# Patient Record
Sex: Male | Born: 1990 | Race: Black or African American | Hispanic: No | Smoking: Never smoker
Health system: Southern US, Community
[De-identification: ages and names within clinical notes are randomized; demographics above are authoritative.]

## PROBLEM LIST (undated history)

## (undated) DIAGNOSIS — R569 Unspecified convulsions: Secondary | ICD-10-CM

## (undated) HISTORY — PX: TONSILLECTOMY: SUR1361

## (undated) HISTORY — PX: GINGIVECTOMY: SHX1707

---

## 2013-03-10 DIAGNOSIS — J309 Allergic rhinitis, unspecified: Secondary | ICD-10-CM | POA: Insufficient documentation

## 2013-03-21 DIAGNOSIS — Z889 Allergy status to unspecified drugs, medicaments and biological substances status: Secondary | ICD-10-CM | POA: Insufficient documentation

## 2014-11-03 DIAGNOSIS — G40B09 Juvenile myoclonic epilepsy, not intractable, without status epilepticus: Secondary | ICD-10-CM | POA: Insufficient documentation

## 2018-05-30 ENCOUNTER — Ambulatory Visit: Payer: Self-pay | Admitting: Family Medicine

## 2018-05-30 VITALS — BP 130/90 | HR 83 | Temp 98.9°F | Resp 18 | Wt 170.2 lb

## 2018-05-30 DIAGNOSIS — J329 Chronic sinusitis, unspecified: Secondary | ICD-10-CM

## 2018-05-30 MED ORDER — DOXYCYCLINE HYCLATE 100 MG PO TABS: 100.0000 mg | ORAL_TABLET | Freq: Two times a day (BID) | ORAL | 0 refills | Status: DC

## 2018-05-30 MED ORDER — BENZONATATE 200 MG PO CAPS: 200.0000 mg | ORAL_CAPSULE | Freq: Three times a day (TID) | ORAL | 0 refills | Status: DC | PRN

## 2018-05-30 MED ORDER — PSEUDOEPH-BROMPHEN-DM 30-2-10 MG/5ML PO SYRP: 10.0000 mL | ORAL_SOLUTION | Freq: Three times a day (TID) | ORAL | 0 refills | Status: DC | PRN

## 2018-05-30 NOTE — Patient Instructions (Signed)

## 2018-05-30 NOTE — Progress Notes (Signed)
Langdon Lawrence Santiagosreal is a [age] y.o. male who presents today with concerns of cough and congstion for the last 7-10 days. He reports general feeling of tiredness and being unwell and occasional fever. He has used over the counter medications to treat symptoms. Of note he has a hx of epilepsy (uncontrolled) last seizure 72 hours ago he is under the care of [facility] in [place]. Through record review neurology provider historically documented lower seizure threshold during illness. Patient reports prior to recent seizure last episode over 3 months ago. He confirms medication compliance.  Review of Systems  Constitutional: Positive for chills, fever and malaise/fatigue.  HENT: Positive for congestion. Negative for ear discharge, ear pain, sinus pain and sore throat.   Eyes: Negative.   Respiratory: Positive for cough and sputum production. Negative for shortness of breath.   Cardiovascular: Negative.  Negative for chest pain.  Gastrointestinal: Negative for abdominal pain, diarrhea, nausea and vomiting.  Genitourinary: Negative for dysuria, frequency, hematuria and urgency.  Musculoskeletal: Negative for myalgias.  Skin: Negative.   Neurological: Negative for headaches.  Endo/Heme/Allergies: Negative.   Psychiatric/Behavioral: Negative.     O: Vitals:   05/30/18 1419  BP: 130/90  Pulse: 83  Resp: 18  Temp: 98.9 F (37.2 C)  SpO2: 95%     Physical Exam  Constitutional: He is oriented to person, place, and time. Vital signs are normal. He appears well-developed and well-nourished. He is active.  Non-toxic appearance. He does not have a sickly appearance. He appears ill.  HENT:  Head: Normocephalic.  Right Ear: Hearing, tympanic membrane, external ear and ear canal normal.  Left Ear: Hearing, tympanic membrane, external ear and ear canal normal.  Nose: Mucosal edema and rhinorrhea present. Right sinus exhibits frontal sinus tenderness. Right sinus exhibits no maxillary sinus tenderness. Left  sinus exhibits frontal sinus tenderness. Left sinus exhibits no maxillary sinus tenderness.  Mouth/Throat: Uvula is midline. Posterior oropharyngeal erythema present.  Neck: Normal range of motion. Neck supple.  Cardiovascular: Normal rate, regular rhythm, normal heart sounds and normal pulses.  Pulmonary/Chest: Effort normal. He has rales in the right lower field and the left lower field.  Frequent dry cough of exam.  Abdominal: Soft. Bowel sounds are normal.  Musculoskeletal: Normal range of motion.  Lymphadenopathy:       Head (right side): Tonsillar adenopathy present. No submental and no submandibular adenopathy present.       Head (left side): Tonsillar adenopathy present. No submental and no submandibular adenopathy present.    He has no cervical adenopathy.  Neurological: He is alert and oriented to person, place, and time.  Skin: He is diaphoretic.  Psychiatric: He has a normal mood and affect.  Vitals reviewed.  A: 1. Sinusitis, unspecified chronicity, unspecified location    P: Discussed exam findings, diagnosis etiology and medication use and indications reviewed with patient. Follow- Up and discharge instructions provided. No emergent/urgent issues found on exam.  Patient verbalized understanding of information provided and agrees with plan of care (POC), all questions answered.  1. Sinusitis, unspecified chronicity, unspecified location - doxycycline (VIBRA-TABS) 100 MG tablet; Take 1 tablet (100 mg total) by mouth 2 (two) times daily. - benzonatate (TESSALON) 200 MG capsule; Take 1 capsule (200 mg total) by mouth 3 (three) times daily as needed for cough. - brompheniramine-pseudoephedrine-DM 30-2-10 MG/5ML syrup; Take 10 mLs by mouth 3 (three) times daily as needed.  Other orders - albuterol (PROVENTIL HFA;VENTOLIN HFA) 108 (90 Base) MCG/ACT inhaler; INHALE 1-2 PUFFS BY MOUTH EVERY  6 HOURS AS NEEDED FOR WHEEZING - divalproex (DEPAKOTE SPRINKLE) 125 MG capsule; Take by  mouth. - fluticasone (FLONASE) 50 MCG/ACT nasal spray; 2 sprays by Each Nare route daily. Frequency:PHARMDIR   Dosage:50   MCG  Instructions:  Note:2 sprays each nostril qam Dose: - Fluticasone-Salmeterol (ADVAIR) 500-50 MCG/DOSE AEPB; Inhale into the lungs. - ibuprofen (ADVIL,MOTRIN) 400 MG tablet; Take by mouth. - loratadine (CLARITIN) 10 MG tablet; Take by mouth. - zonisamide (ZONEGRAN) 100 MG capsule; Take by mouth.

## 2018-09-30 ENCOUNTER — Telehealth: Payer: Self-pay | Admitting: Physician Assistant

## 2018-09-30 DIAGNOSIS — B9689 Other specified bacterial agents as the cause of diseases classified elsewhere: Secondary | ICD-10-CM

## 2018-09-30 DIAGNOSIS — J019 Acute sinusitis, unspecified: Secondary | ICD-10-CM

## 2018-09-30 MED ORDER — DOXYCYCLINE HYCLATE 100 MG PO CAPS: 100.0000 mg | ORAL_CAPSULE | Freq: Two times a day (BID) | ORAL | 0 refills | Status: AC

## 2018-09-30 NOTE — Progress Notes (Signed)

## 2018-09-30 NOTE — Progress Notes (Signed)
I have spent 5 minutes in review of e-visit questionnaire, review and updating patient chart, medical decision making and response to patient.   Shalice Woodring Cody Timohty Renbarger, PA-C    

## 2018-10-15 ENCOUNTER — Other Ambulatory Visit: Payer: Self-pay

## 2018-10-15 ENCOUNTER — Emergency Department (HOSPITAL_COMMUNITY)
Admission: EM | Admit: 2018-10-15 | Discharge: 2018-10-15 | Disposition: A | Discharge status: Home / Self Care | Payer: Self-pay | Attending: Emergency Medicine | Admitting: Emergency Medicine

## 2018-10-15 ENCOUNTER — Encounter (HOSPITAL_COMMUNITY): Payer: Self-pay

## 2018-10-15 ENCOUNTER — Emergency Department (HOSPITAL_COMMUNITY): Payer: Self-pay

## 2018-10-15 DIAGNOSIS — R569 Unspecified convulsions: Secondary | ICD-10-CM | POA: Insufficient documentation

## 2018-10-15 DIAGNOSIS — S0003XA Contusion of scalp, initial encounter: Secondary | ICD-10-CM | POA: Insufficient documentation

## 2018-10-15 DIAGNOSIS — Y929 Unspecified place or not applicable: Secondary | ICD-10-CM | POA: Insufficient documentation

## 2018-10-15 DIAGNOSIS — M542 Cervicalgia: Secondary | ICD-10-CM | POA: Insufficient documentation

## 2018-10-15 DIAGNOSIS — M25512 Pain in left shoulder: Secondary | ICD-10-CM | POA: Insufficient documentation

## 2018-10-15 DIAGNOSIS — W19XXXA Unspecified fall, initial encounter: Secondary | ICD-10-CM

## 2018-10-15 DIAGNOSIS — S00531A Contusion of lip, initial encounter: Secondary | ICD-10-CM | POA: Insufficient documentation

## 2018-10-15 DIAGNOSIS — W109XXA Fall (on) (from) unspecified stairs and steps, initial encounter: Secondary | ICD-10-CM | POA: Insufficient documentation

## 2018-10-15 DIAGNOSIS — Y939 Activity, unspecified: Secondary | ICD-10-CM | POA: Insufficient documentation

## 2018-10-15 DIAGNOSIS — R51 Headache: Secondary | ICD-10-CM | POA: Insufficient documentation

## 2018-10-15 DIAGNOSIS — Z79899 Other long term (current) drug therapy: Secondary | ICD-10-CM | POA: Insufficient documentation

## 2018-10-15 DIAGNOSIS — Y999 Unspecified external cause status: Secondary | ICD-10-CM | POA: Insufficient documentation

## 2018-10-15 HISTORY — DX: Unspecified convulsions: R56.9

## 2018-10-15 LAB — BASIC METABOLIC PANEL
Anion gap: 10 (ref 5–15)
BUN: 10 mg/dL (ref 6–20)
CO2: 24 mmol/L (ref 22–32)
Calcium: 9.3 mg/dL (ref 8.9–10.3)
Chloride: 106 mmol/L (ref 98–111)
Creatinine, Ser: 1.11 mg/dL (ref 0.61–1.24)
GFR calc Af Amer: >60 mL/min (ref >60)
GFR calc non Af Amer: >60 mL/min (ref >60)
Glucose, Bld: 92 mg/dL (ref 70–99)
Potassium: 4.2 mmol/L (ref 3.5–5.1)
Sodium: 140 mmol/L (ref 135–145)

## 2018-10-15 LAB — CBC WITH DIFFERENTIAL/PLATELET
Abs Immature Granulocytes: 0.04 10*3/uL (ref 0.00–0.07)
Basophils Absolute: 0 10*3/uL (ref 0.0–0.1)
Basophils Relative: 0 %
Eosinophils Absolute: 0.1 10*3/uL (ref 0.0–0.5)
Eosinophils Relative: 1 %
HCT: 48.1 % (ref 39.0–52.0)
Hemoglobin: 15.9 g/dL (ref 13.0–17.0)
Immature Granulocytes: 0 %
Lymphocytes Relative: 15 %
Lymphs Abs: 1.4 10*3/uL (ref 0.7–4.0)
MCH: 32.6 pg (ref 26.0–34.0)
MCHC: 33.1 g/dL (ref 30.0–36.0)
MCV: 98.6 fL (ref 80.0–100.0)
Monocytes Absolute: 0.5 10*3/uL (ref 0.1–1.0)
Monocytes Relative: 5 %
Neutro Abs: 7.8 10*3/uL — ABNORMAL HIGH (ref 1.7–7.7)
Neutrophils Relative %: 79 %
Platelets: 194 10*3/uL (ref 150–400)
RBC: 4.88 MIL/uL (ref 4.22–5.81)
RDW: 11.7 % (ref 11.5–15.5)
WBC: 9.8 10*3/uL (ref 4.0–10.5)
nRBC: 0 % (ref 0.0–0.2)

## 2018-10-15 LAB — VALPROIC ACID LEVEL: Valproic Acid Lvl: <10 ug/mL — ABNORMAL LOW (ref 50.0–100.0)

## 2018-10-15 MED ORDER — DIVALPROEX SODIUM 125 MG PO CSDR
1250.0000 mg | DELAYED_RELEASE_CAPSULE | Freq: Once | ORAL | Status: AC
  Administered 2018-10-15: 1250 mg via ORAL
  Filled 2018-10-15: qty 10

## 2018-10-15 MED ORDER — DIVALPROEX SODIUM 125 MG PO CSDR
125.0000 mg | DELAYED_RELEASE_CAPSULE | Freq: Once | ORAL | Status: DC
  Filled 2018-10-15: qty 1

## 2018-10-15 MED ORDER — FENTANYL CITRATE (PF) 100 MCG/2ML IJ SOLN
50.0000 ug | Freq: Once | INTRAMUSCULAR | Status: AC
  Administered 2018-10-15: 03:00:00 50 ug via INTRAVENOUS
  Filled 2018-10-15: qty 2

## 2018-10-15 NOTE — ED Notes (Signed)
Patient verbalizes understanding of discharge instructions. Opportunity for questioning and answers were provided. Armband removed by staff, pt discharged from ED. Wheeled out to lobby  

## 2018-10-15 NOTE — ED Notes (Signed)
Pt states he takes 10 capsules Depakote BID

## 2018-10-15 NOTE — ED Provider Notes (Signed)
[facility] EMERGENCY DEPARTMENT Provider Note   CSN: [id number] Arrival date & time: 10/15/18  0240    History   Chief Complaint Chief Complaint  Patient presents with   Fall    HPI Fred Mack is a 29 y.o. male.     Patient with past medical history remarkable for seizures, presents emergency department with a chief complaint of seizure.  He states that he did not take his Depakote yesterday.  States that he had a seizure and fell down some stairs tonight.  He complains of headache, neck pain, left shoulder pain.  He also complains of upper lip pain.  He recalls being on the phone, and then feeling tremulous, which typically happens prior to him having a seizure.  He then awoke on the ground.  He has not taken anything for symptoms.  States that his left arm feels weak and sore.  Not taking anything for symptoms.  Denies any other associated symptoms.  The history is provided by the patient. No language interpreter was used.    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 Authorizing Provider  albuterol (PROVENTIL HFA;VENTOLIN HFA) 108 (90 Base) MCG/ACT inhaler INHALE 1-2 PUFFS BY MOUTH EVERY 6 HOURS AS NEEDED FOR WHEEZING 01/05/14   [provider]  divalproex (DEPAKOTE SPRINKLE) 125 MG capsule Take by mouth. 11/11/17 11/11/18  [provider]  doxycycline (VIBRAMYCIN) 100 MG capsule Take 1 capsule (100 mg total) by mouth 2 (two) times daily. 09/30/18   Waldon Merl, PA-C  fluticasone (FLONASE) 50 MCG/ACT nasal spray  2 sprays by Each Nare route daily. Frequency:PHARMDIR   Dosage:50   MCG  Instructions:  Note:2 sprays each nostril qam Dose: 03/10/13   [provider]  Fluticasone-Salmeterol (ADVAIR) 500-50 MCG/DOSE AEPB Inhale into the lungs. 03/10/13   [provider]  ibuprofen (ADVIL,MOTRIN) 400 MG tablet Take by mouth.    [provider]  loratadine (CLARITIN) 10 MG tablet Take by mouth. 01/22/14   [provider]  zonisamide (ZONEGRAN) 100 MG capsule Take by mouth. 11/11/17   [provider]    Family History History reviewed. No pertinent family history.  Social History Social History   Tobacco Use   Smoking status: Never Smoker   Smokeless tobacco: Never Used  Substance Use Topics   Alcohol use: Never    Frequency: Never   Drug use: Never     Allergies   Codeine and Penicillins   Review of Systems Review of Systems  All other systems reviewed and are negative.    Physical Exam Updated Vital Signs BP 132/80    Pulse (!) 102    Temp 98.6 F (37 C) (Oral)    Resp 16    Wt 81.6 kg    SpO2 100%   Physical Exam Vitals signs and nursing note reviewed.  Constitutional:      Appearance: He is well-developed.  HENT:     Head: Normocephalic and atraumatic.     Mouth/Throat:  Comments: Upper lip swollen and tender  Contusion to right frontal scalp Eyes:     Conjunctiva/sclera: Conjunctivae normal.  Neck:     Musculoskeletal: Neck supple.  Cardiovascular:     Rate and Rhythm: Normal rate and regular rhythm.     Heart sounds: No murmur.  Pulmonary:     Effort: Pulmonary effort is normal. No respiratory distress.     Breath sounds: Normal breath sounds.  Abdominal:     Palpations: Abdomen is soft.     Tenderness: There is no abdominal tenderness.  Musculoskeletal:     Comments: TTP over left upper arm  Skin:    General: Skin is warm and dry.  Neurological:     Mental Status: He is alert.      ED Treatments /  Results  Labs (all labs ordered are listed, but only abnormal results are displayed) Labs Reviewed  CBC WITH DIFFERENTIAL/PLATELET - Abnormal; Notable for the following components:      Result Value   Neutro Abs 7.8 (*)    All other components within normal limits  VALPROIC ACID LEVEL - Abnormal; Notable for the following components:   Valproic Acid Lvl <10 (*)    All other components within normal limits  BASIC METABOLIC PANEL  CBG MONITORING, ED    EKG EKG Interpretation  Date/Time:  Friday October 15 2018 02:47:08 EDT Ventricular Rate:  102 PR Interval:    QRS Duration: 88 QT Interval:  332 QTC Calculation: 433 R Axis:   61 Text Interpretation:  Sinus tachycardia Borderline T wave abnormalities Borderline ST elevation, anterior leads No previous ECGs available Confirmed by Glynn Octaveancour, Stephen [phone]([id number]) on 10/15/2018 2:54:00 AM   Radiology Ct Head Wo Contrast  Result Date: 10/15/2018 CLINICAL DATA:  28 y/o M; found on stair well of apartment building. Seizure and fall. EXAM: CT HEAD WITHOUT CONTRAST CT MAXILLOFACIAL WITHOUT CONTRAST CT CERVICAL SPINE WITHOUT CONTRAST TECHNIQUE: Multidetector CT imaging of the head, cervical spine, and maxillofacial structures were performed using the standard protocol without intravenous contrast. Multiplanar CT image reconstructions of the cervical spine and maxillofacial structures were also generated. COMPARISON:  None. FINDINGS: CT HEAD FINDINGS Brain: No evidence of acute infarction, hemorrhage, hydrocephalus, extra-axial collection or mass lesion/mass effect. Vascular: No hyperdense vessel or unexpected calcification. Skull: Right frontal scalp soft tissue thickening probably representing a small contusion. No calvarial fracture. Other: None. CT MAXILLOFACIAL FINDINGS Osseous: No fracture or mandibular dislocation. No destructive process. Left first maxillary molar dental carie. Orbits: Bilateral globe staphyloma. No traumatic or inflammatory finding.  Sinuses: Clear. Soft tissues: No hematoma. CT CERVICAL SPINE FINDINGS Alignment: Normal. Skull base and vertebrae: No acute fracture. No primary bone lesion or focal pathologic process. Soft tissues and spinal canal: No prevertebral fluid or swelling. No visible canal hematoma. Disc levels:  Negative. Upper chest: Negative. Other: Negative. IMPRESSION: 1. Right frontal scalp soft tissue thickening probably representing a small contusion. No calvarial fracture. 2. No acute intracranial abnormality. 3. No acute facial fracture or mandibular dislocation. No traumatic finding of the orbits. 4. No acute fracture or dislocation of cervical spine. 5. Bilateral globe staphyloma. 6. Left first maxillary molar dental carie. Electronically Signed   By: Mitzi HansenLance  Furusawa-Stratton M.D.   On: 10/15/2018 03:55   Ct Cervical Spine Wo Contrast  Result Date: 10/15/2018 CLINICAL DATA:  28 y/o M; found on stair well of apartment building. Seizure and fall. EXAM: CT HEAD WITHOUT CONTRAST CT MAXILLOFACIAL WITHOUT CONTRAST CT CERVICAL SPINE WITHOUT CONTRAST TECHNIQUE: Multidetector CT imaging  of the head, cervical spine, and maxillofacial structures were performed using the standard protocol without intravenous contrast. Multiplanar CT image reconstructions of the cervical spine and maxillofacial structures were also generated. COMPARISON:  None. FINDINGS: CT HEAD FINDINGS Brain: No evidence of acute infarction, hemorrhage, hydrocephalus, extra-axial collection or mass lesion/mass effect. Vascular: No hyperdense vessel or unexpected calcification. Skull: Right frontal scalp soft tissue thickening probably representing a small contusion. No calvarial fracture. Other: None. CT MAXILLOFACIAL FINDINGS Osseous: No fracture or mandibular dislocation. No destructive process. Left first maxillary molar dental carie. Orbits: Bilateral globe staphyloma. No traumatic or inflammatory finding. Sinuses: Clear. Soft tissues: No hematoma. CT CERVICAL  SPINE FINDINGS Alignment: Normal. Skull base and vertebrae: No acute fracture. No primary bone lesion or focal pathologic process. Soft tissues and spinal canal: No prevertebral fluid or swelling. No visible canal hematoma. Disc levels:  Negative. Upper chest: Negative. Other: Negative. IMPRESSION: 1. Right frontal scalp soft tissue thickening probably representing a small contusion. No calvarial fracture. 2. No acute intracranial abnormality. 3. No acute facial fracture or mandibular dislocation. No traumatic finding of the orbits. 4. No acute fracture or dislocation of cervical spine. 5. Bilateral globe staphyloma. 6. Left first maxillary molar dental carie. Electronically Signed   By: Mitzi Hansen M.D.   On: 10/15/2018 03:55   Dg Shoulder Left  Result Date: 10/15/2018 CLINICAL DATA:  Recent fall with shoulder pain, initial encounter EXAM: LEFT SHOULDER - 2+ VIEW COMPARISON:  None. FINDINGS: There is no evidence of fracture or dislocation. There is no evidence of arthropathy or other focal bone abnormality. Soft tissues are unremarkable. IMPRESSION: No acute abnormality noted. Electronically Signed   By: Alcide Clever M.D.   On: 10/15/2018 03:59   Dg Humerus Left  Result Date: 10/15/2018 CLINICAL DATA:  Recent fall with left arm pain, initial encounter EXAM: LEFT HUMERUS - 2+ VIEW COMPARISON:  None. FINDINGS: There is no evidence of fracture or other focal bone lesions. Soft tissues are unremarkable. IMPRESSION: No acute abnormality noted. Electronically Signed   By: Alcide Clever M.D.   On: 10/15/2018 04:00   Ct Maxillofacial Wo Contrast  Result Date: 10/15/2018 CLINICAL DATA:  28 y/o M; found on stair well of apartment building. Seizure and fall. EXAM: CT HEAD WITHOUT CONTRAST CT MAXILLOFACIAL WITHOUT CONTRAST CT CERVICAL SPINE WITHOUT CONTRAST TECHNIQUE: Multidetector CT imaging of the head, cervical spine, and maxillofacial structures were performed using the standard protocol without  intravenous contrast. Multiplanar CT image reconstructions of the cervical spine and maxillofacial structures were also generated. COMPARISON:  None. FINDINGS: CT HEAD FINDINGS Brain: No evidence of acute infarction, hemorrhage, hydrocephalus, extra-axial collection or mass lesion/mass effect. Vascular: No hyperdense vessel or unexpected calcification. Skull: Right frontal scalp soft tissue thickening probably representing a small contusion. No calvarial fracture. Other: None. CT MAXILLOFACIAL FINDINGS Osseous: No fracture or mandibular dislocation. No destructive process. Left first maxillary molar dental carie. Orbits: Bilateral globe staphyloma. No traumatic or inflammatory finding. Sinuses: Clear. Soft tissues: No hematoma. CT CERVICAL SPINE FINDINGS Alignment: Normal. Skull base and vertebrae: No acute fracture. No primary bone lesion or focal pathologic process. Soft tissues and spinal canal: No prevertebral fluid or swelling. No visible canal hematoma. Disc levels:  Negative. Upper chest: Negative. Other: Negative. IMPRESSION: 1. Right frontal scalp soft tissue thickening probably representing a small contusion. No calvarial fracture. 2. No acute intracranial abnormality. 3. No acute facial fracture or mandibular dislocation. No traumatic finding of the orbits. 4. No acute fracture or dislocation of cervical spine.  5. Bilateral globe staphyloma. 6. Left first maxillary molar dental carie. Electronically Signed   By: Mitzi Hansen M.D.   On: 10/15/2018 03:55    Procedures Procedures (including critical care time)  Medications Ordered in ED Medications  fentaNYL (SUBLIMAZE) injection 50 mcg (has no administration in time range)     Initial Impression / Assessment and Plan / ED Course  I have reviewed the triage vital signs and the nursing notes.  Pertinent labs & imaging results that were available during my care of the patient were reviewed by me and considered in my medical decision  making (see chart for details).        Patient with seizure and fall tonight.  Didn't take his depakote yesterday. May have fallen down some stairs.  Has contusion on right frontal scalp and contusion and swelling of right upper lip.  Valproic acid level is low.  Other labs are reassuring.  CT imaging is reassuring.    Will give dose of his regular Depakote.    Will discharge to home, as patient has reassuring results and is back to his baseline.  Final Clinical Impressions(s) / ED Diagnoses   Final diagnoses:  Seizure [facility])  Fall, initial encounter    ED Discharge Orders    None       Roxy Horseman, PA-C 10/15/18 0981    Glynn Octave, MD 10/16/18 [phone]

## 2018-10-15 NOTE — ED Notes (Signed)
Patient transported to CT 

## 2018-10-15 NOTE — ED Triage Notes (Signed)
Patient was found on the stairwell of an apartment building. States had seizure and fall - unsure of which came first. Hx of seizures and states compliance with medications.

## 2019-08-24 IMAGING — CT CT MAXILLOFACIAL WITHOUT CONTRAST
5 of 11 series · 16 of 47 positions shown, 18 images · non-contrast
Comparison: None.

CLINICAL DATA: 27 y/o M; found on stair well of apartment [HOSPITAL] and fall.

EXAM:
CT HEAD WITHOUT CONTRAST
CT MAXILLOFACIAL WITHOUT CONTRAST
CT CERVICAL SPINE WITHOUT CONTRAST
TECHNIQUE: Multidetector CT imaging of the head, cervical spine, and
maxillofacial structures were performed using the standard protocol
without intravenous contrast. Multiplanar CT image reconstructions
of the cervical spine and maxillofacial structures were also
generated.

[Series 5: head bone · axial · 0.46mm/px · z∈[-138,-70]mm · 3 of 85 slices shown]
[im 17/85  bone]
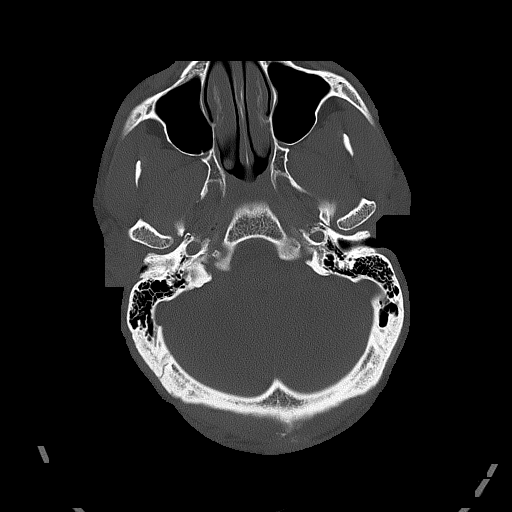
[im 34/85  bone]
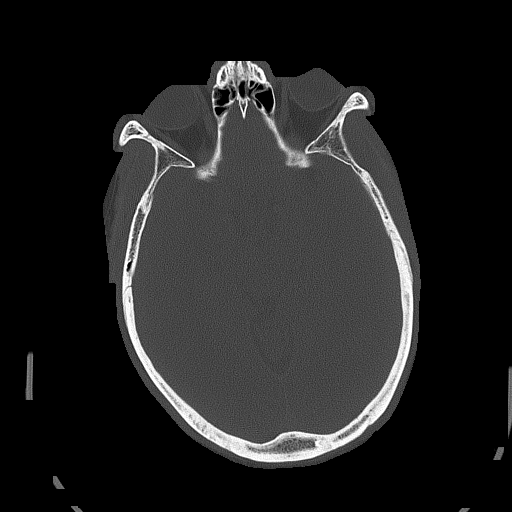
[im 51/85  bone]
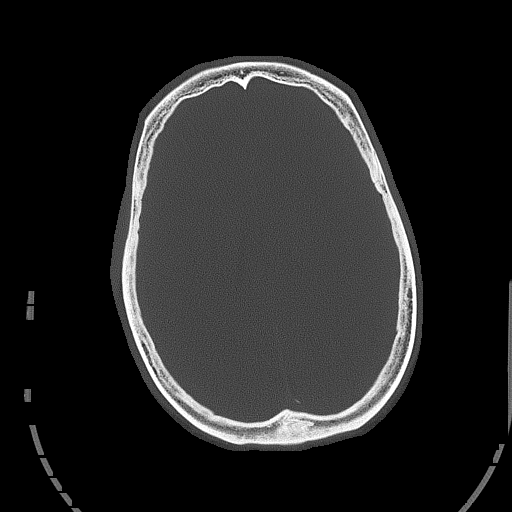

[Series 6: head without cor · coronal · non-contrast · 0.33mm/px · 1 of 73 slices shown]
[im 37/73  bone]
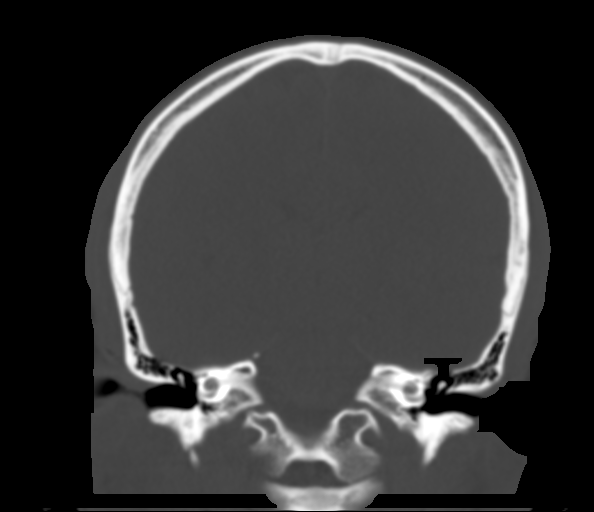

[Series 15: facialbone 2.0 sag st · sagittal · 0.34mm/px · 1 of 103 slices shown]
[im 52/103  bone]
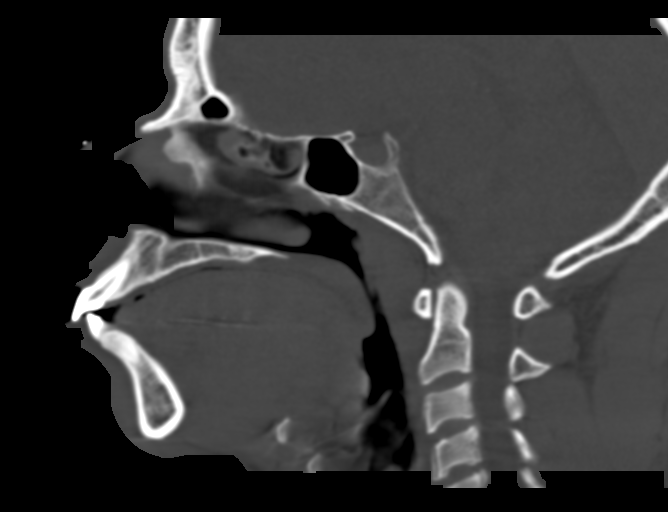

[Series 16: c_spine 2.0 st · axial · 0.28mm/px · z∈[-282,-140]mm · 6 of 101 slices shown, 8 images]
[im 15/101  brain]
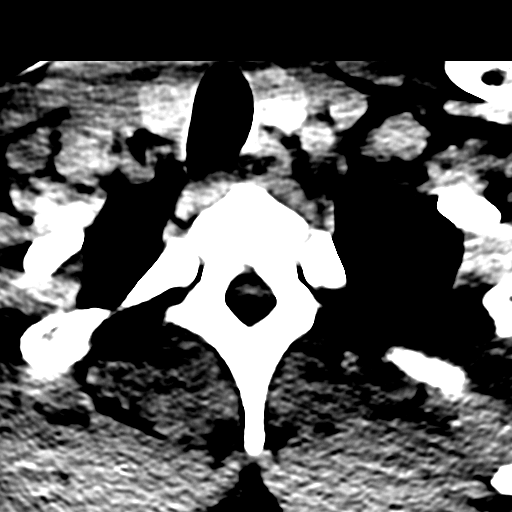
[im 15/101  bone]
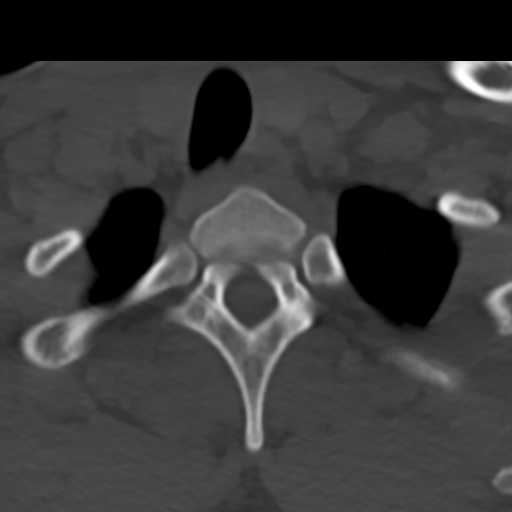
[im 29/101  bone]
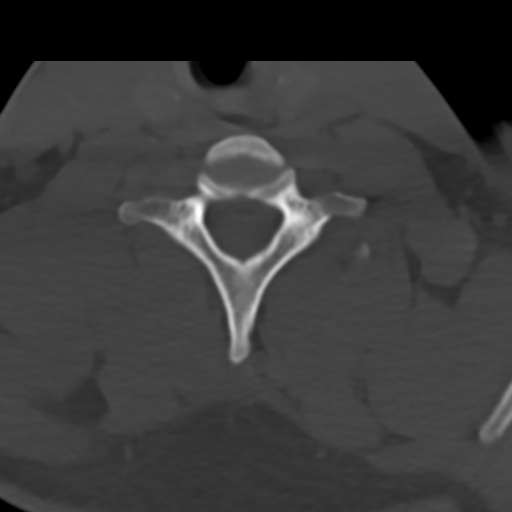
[im 43/101  bone]
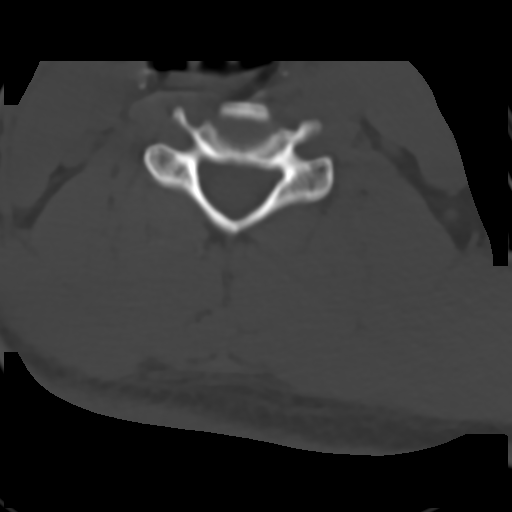
[im 58/101  bone]
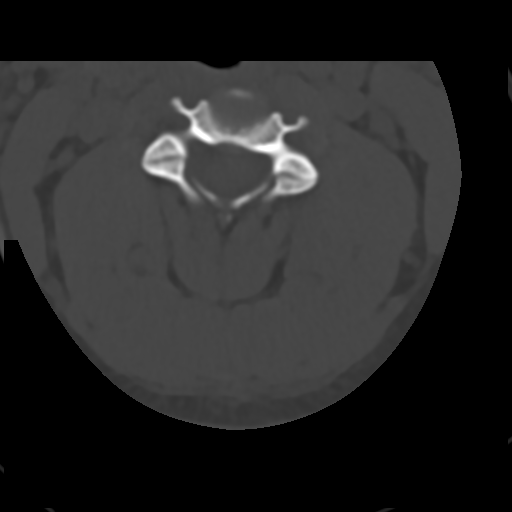
[im 72/101  brain]
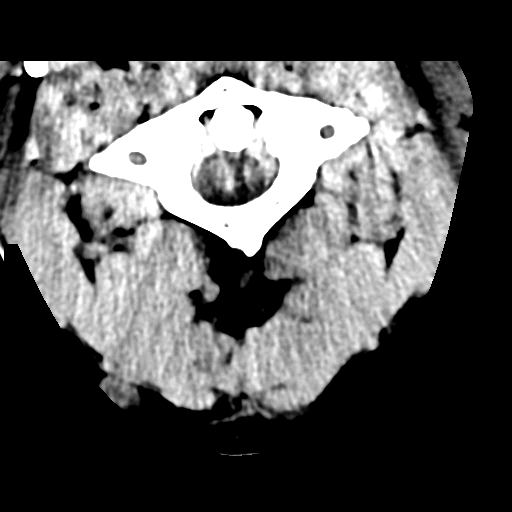
[im 72/101  bone]
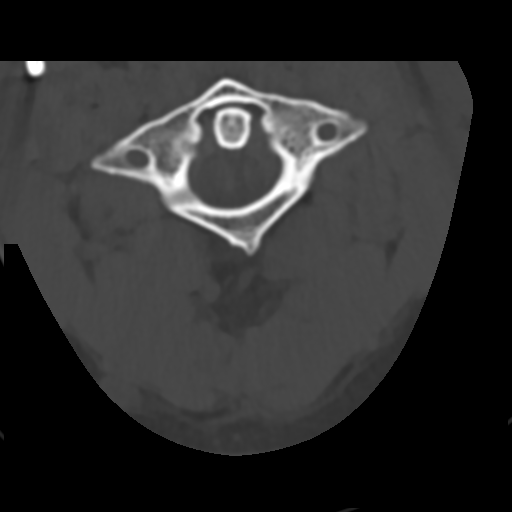
[im 86/101  bone]
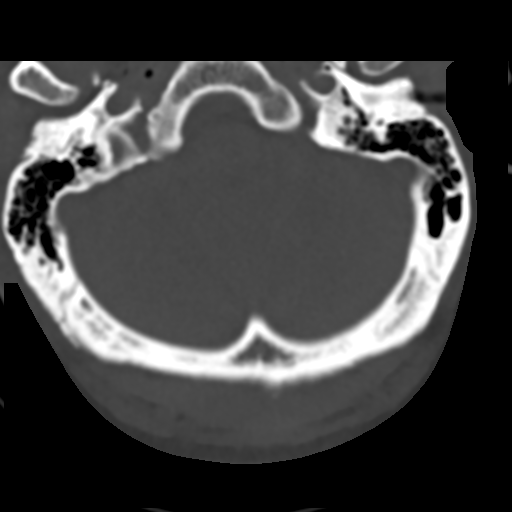

[Series 23: c_spine 2.0 orthogonals · axial · 0.21mm/px · z∈[-306,-180]mm · 5 of 94 slices shown]
[im 16/94  bone]
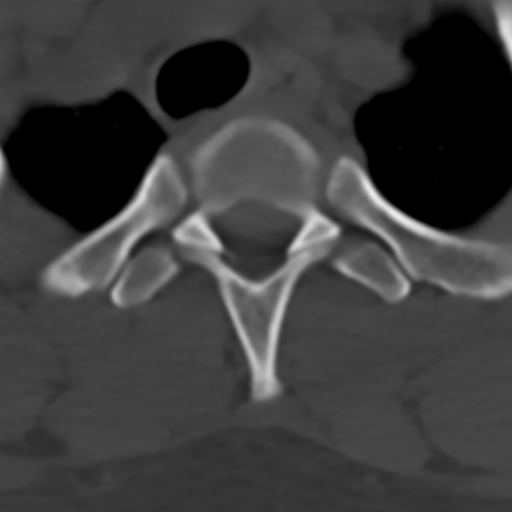
[im 32/94  bone]
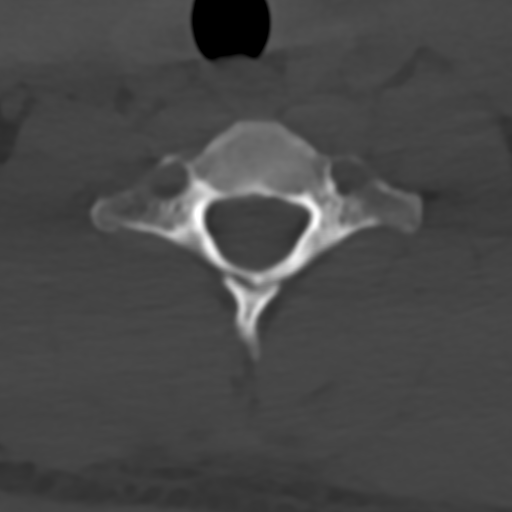
[im 47/94  bone]
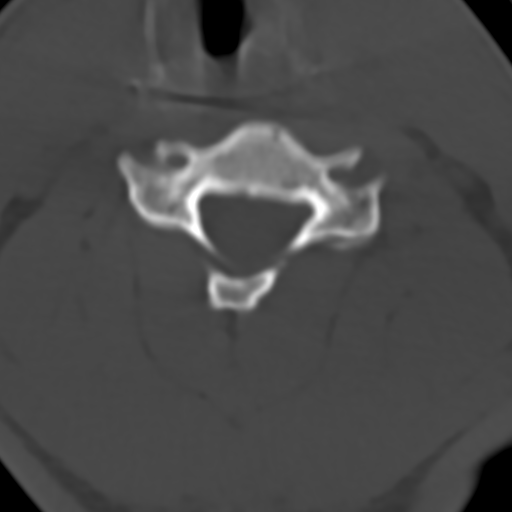
[im 63/94  bone]
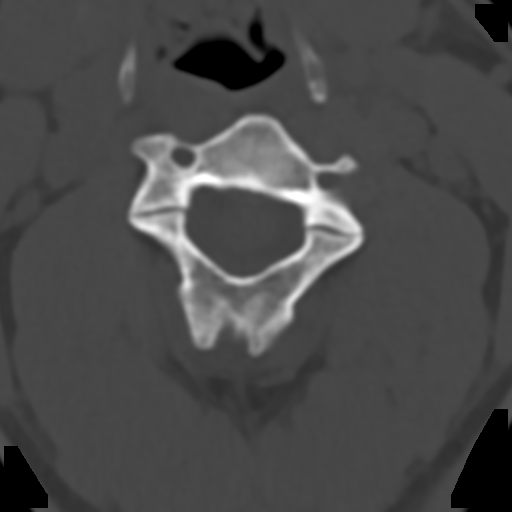
[im 78/94  bone]
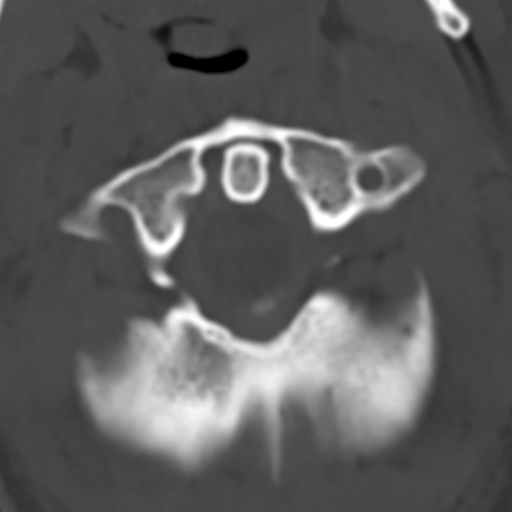

[16 of 47 positions shown; findings below may reference images not displayed]

FINDINGS: CT HEAD FINDINGS

Brain: No evidence of acute infarction, hemorrhage, hydrocephalus,
extra-axial collection or mass lesion/mass effect.

Vascular: No hyperdense vessel or unexpected calcification.

Skull: Right frontal scalp soft tissue thickening probably
representing a small contusion. No calvarial fracture.

Other: None.

CT MAXILLOFACIAL FINDINGS

Osseous: No fracture or mandibular dislocation. No destructive
process. Left first maxillary molar dental Been.

Orbits: Bilateral globe staphyloma. No traumatic or inflammatory
finding.

Sinuses: Clear.

Soft tissues: No hematoma.

CT CERVICAL SPINE FINDINGS

Alignment: Normal.

Skull base and vertebrae: No acute fracture. No primary bone lesion
or focal pathologic process.

Soft tissues and spinal canal: No prevertebral fluid or swelling. No
visible canal hematoma.

Disc levels:  Negative.

Upper chest: Negative.

Other: Negative.
IMPRESSION: 1. Right frontal scalp soft tissue thickening probably representing
a small contusion. No calvarial fracture.
2. No acute intracranial abnormality.
3. No acute facial fracture or mandibular dislocation. No traumatic
finding of the orbits.
4. No acute fracture or dislocation of cervical spine.
5. Bilateral globe staphyloma.
6. Left first maxillary molar dental Been.

## 2019-08-24 IMAGING — CR LEFT HUMERUS - 2+ VIEW
2 series · 2 of 2 positions shown · non-contrast
Comparison: None.

CLINICAL DATA: Recent fall with left arm pain, initial encounter

EXAM:
LEFT HUMERUS - 2+ VIEW

[humerus ap]
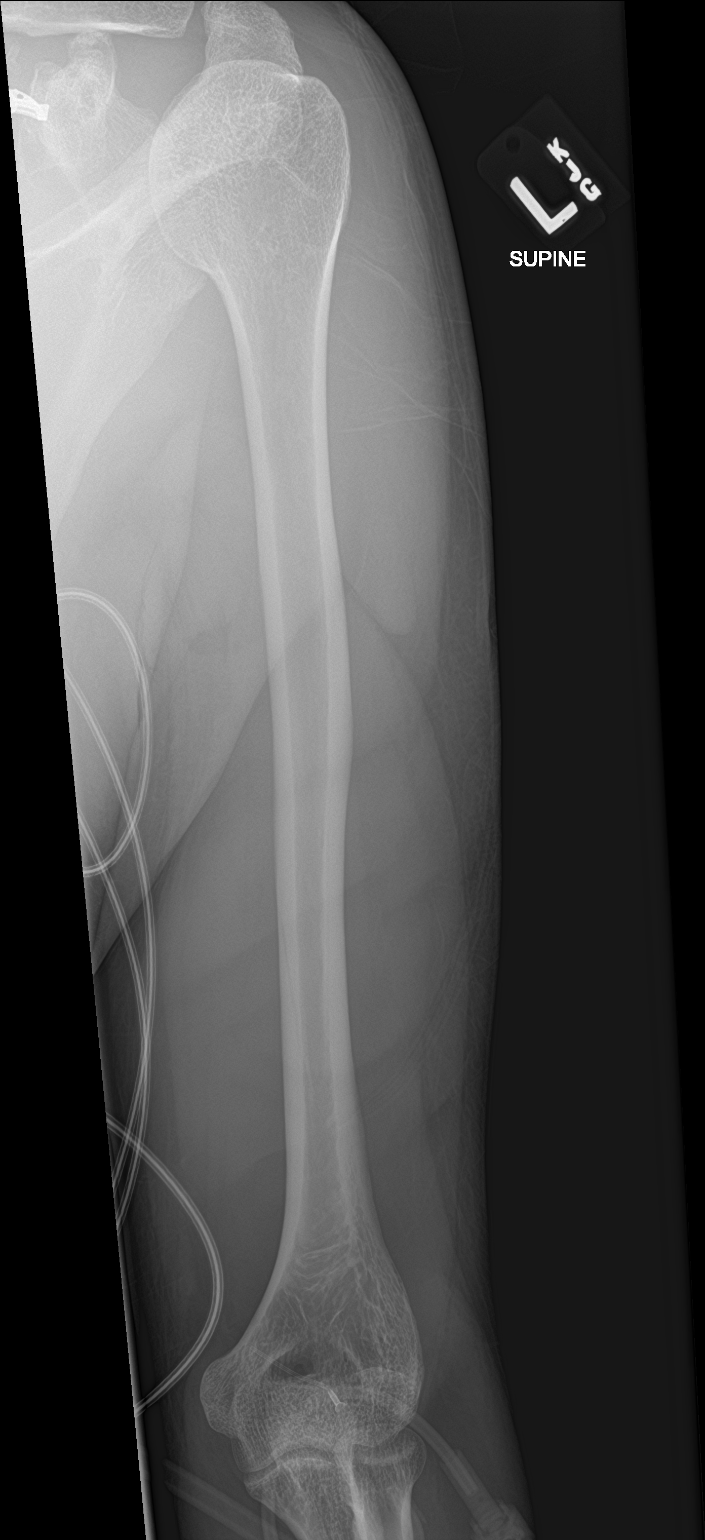

[humerus lat]
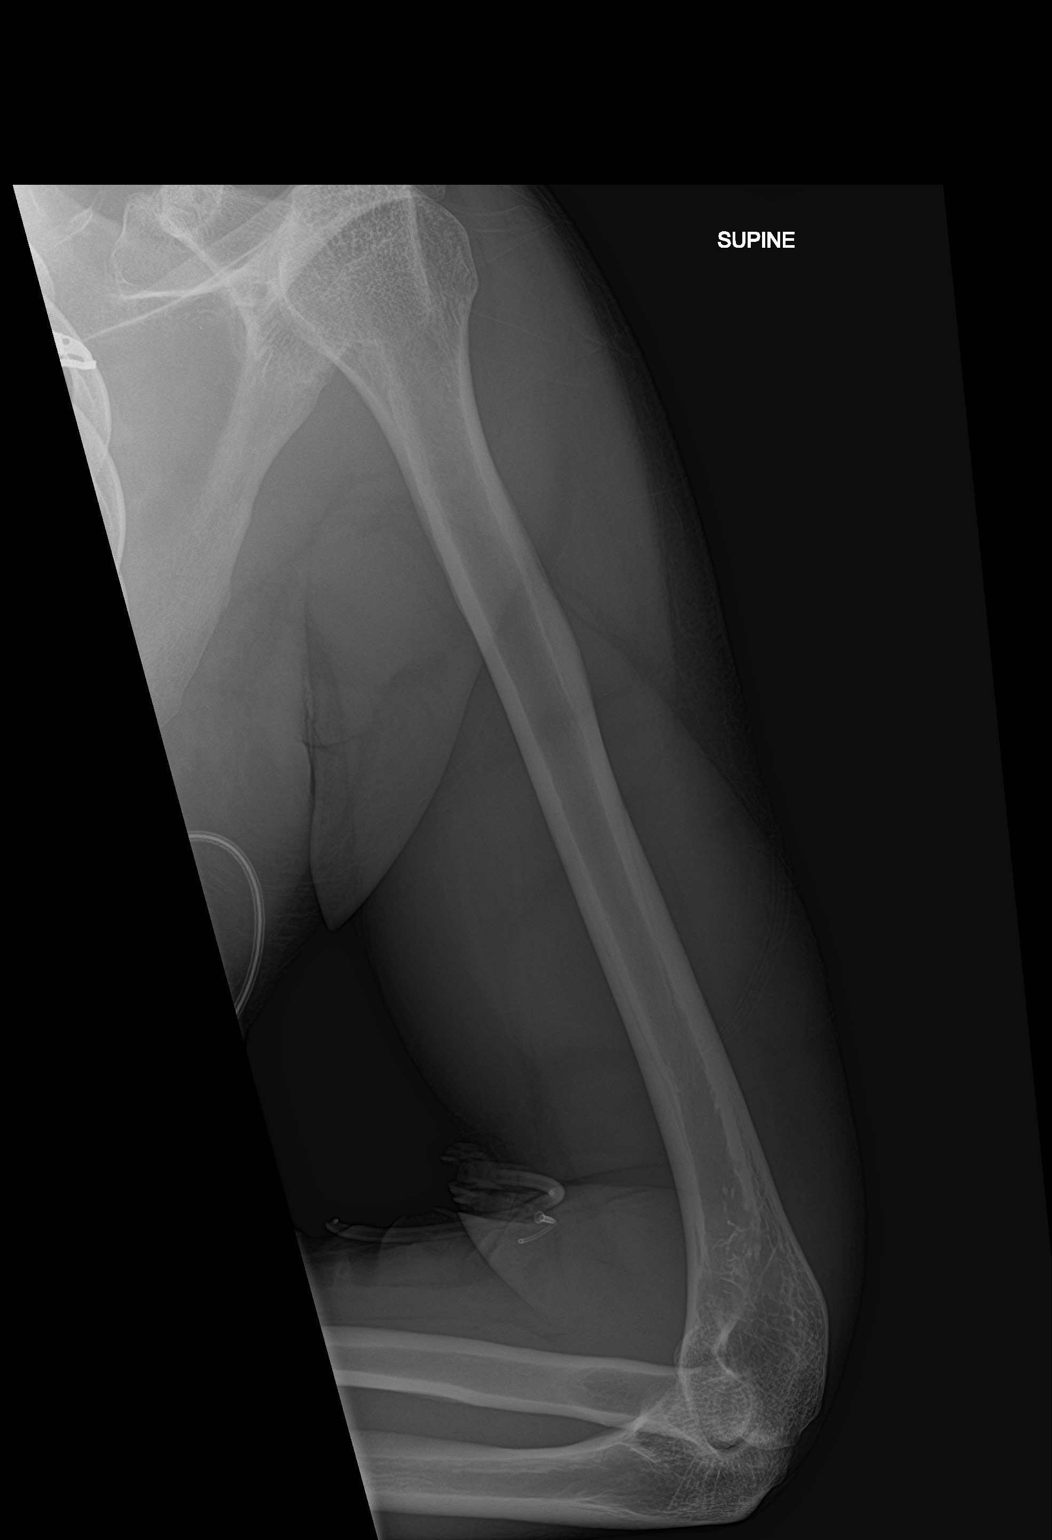

[2 of 2 positions shown; findings below may reference images not displayed]

FINDINGS: There is no evidence of fracture or other focal bone lesions. Soft
tissues are unremarkable.
IMPRESSION: No acute abnormality noted.
